# Patient Record
Sex: Male | Born: 1995 | Race: White | Hispanic: No | Marital: Single | Smoking: Current every day smoker
Health system: Southern US, Community
[De-identification: ages and names within clinical notes are randomized; demographics above are authoritative.]

## PROBLEM LIST (undated history)

## (undated) DIAGNOSIS — R002 Palpitations: Secondary | ICD-10-CM

## (undated) DIAGNOSIS — S060XAA Concussion with loss of consciousness status unknown, initial encounter: Secondary | ICD-10-CM

## (undated) DIAGNOSIS — S060X9A Concussion with loss of consciousness of unspecified duration, initial encounter: Secondary | ICD-10-CM

## (undated) HISTORY — DX: Palpitations: R00.2

## (undated) HISTORY — PX: NOSE SURGERY: SHX723

## (undated) HISTORY — PX: WRIST SURGERY: SHX841

---
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
## 2016-03-30 IMAGING — CT CT CERVICAL SPINE W/O CM
3 of 8 series · 6 of 33 positions shown, 7 images · non-contrast
Comparison: None.

CLINICAL DATA: Soccer injury. Patient landed on head. Concussion.
Cervicalgia/neck pain. Neck trauma.

EXAM:
CT CERVICAL SPINE WITHOUT CONTRAST
TECHNIQUE: Multidetector CT imaging of the cervical spine was performed without
intravenous contrast. Multiplanar CT image reconstructions were also
generated.

[Series 208: coronal 2 · coronal · 0.32mm/px · 1 of 38 slices shown]
[im 19/38  bone]
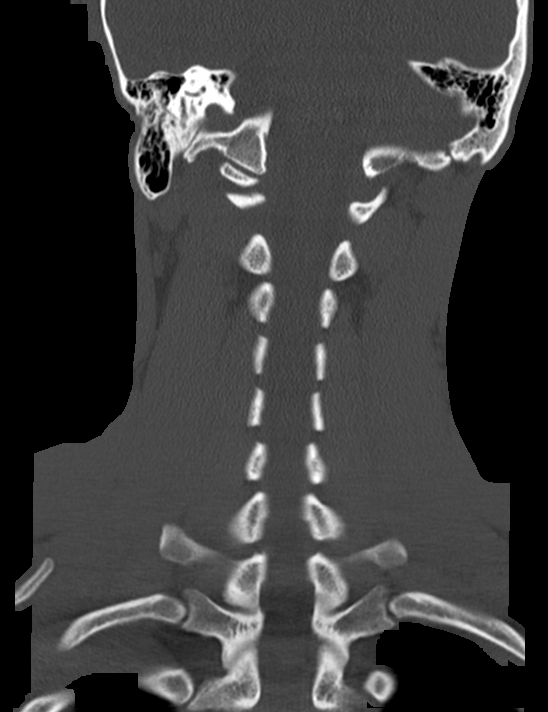

[Series 209: sagittal 2 · sagittal · 0.32mm/px · 2 of 36 slices shown]
[im 12/36  bone]
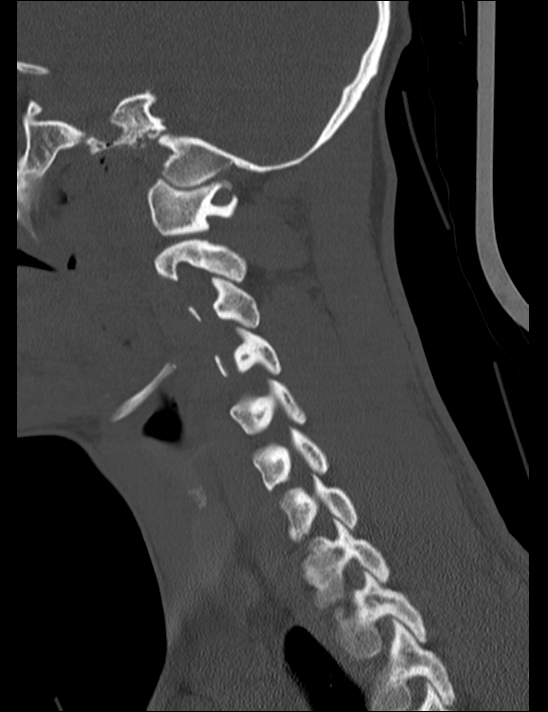
[im 24/36  bone]
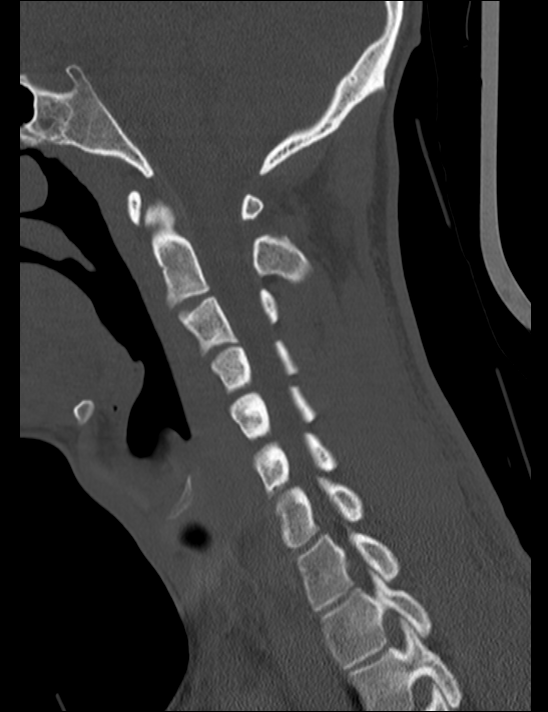

[Series 2010: ortog 2 · axial · 0.32mm/px · z∈[+727,+898]mm · 3 of 94 slices shown, 4 images]
[im 1/94  soft-tissue]
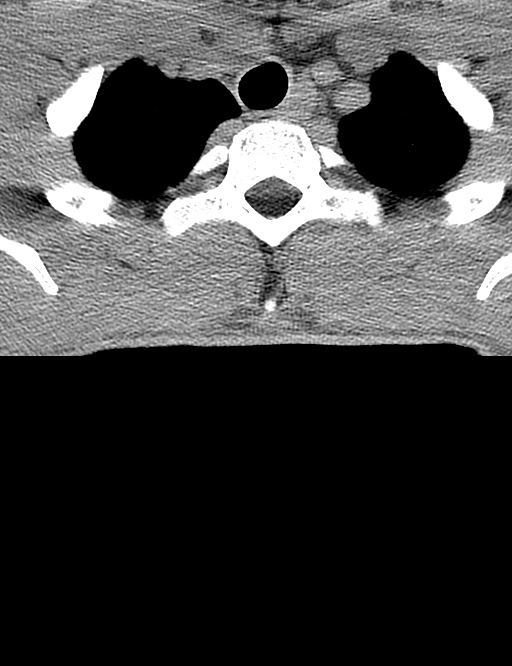
[im 1/94  bone]
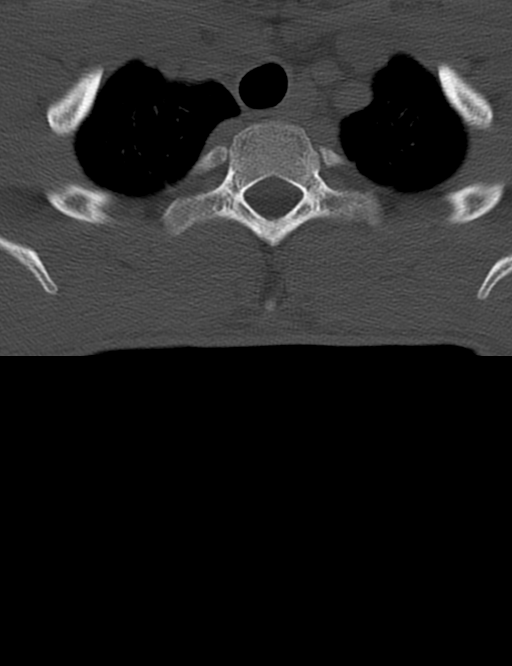
[im 47/94  bone]
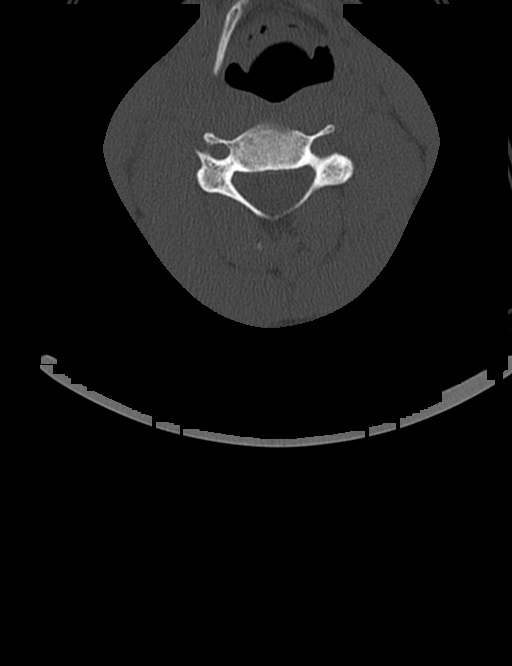
[im 94/94  bone]
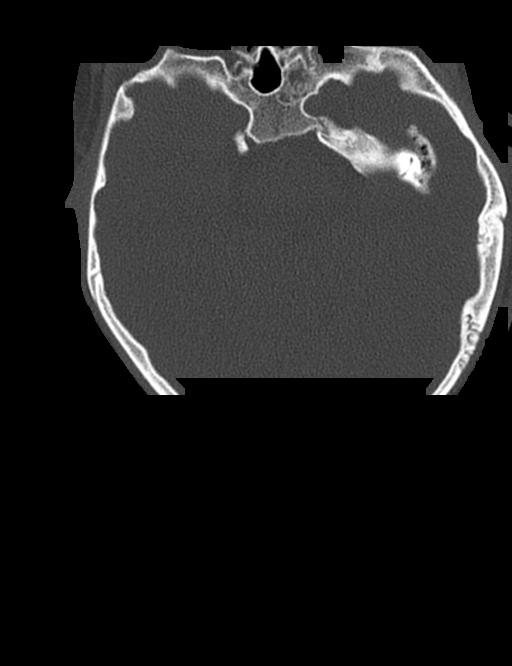

[6 of 33 positions shown; findings below may reference images not displayed]

FINDINGS: Straightening of the normal cervical lordosis is present mild
dextroconvex torticollis is probably positional. Cervical collar is
present. There is no cervical spine fracture. Craniocervical
alignment appears normal. The prevertebral soft tissues are normal.
Odontoid intact. Lung apices appear within normal limits.
IMPRESSION: Negative CT cervical spine.

## 2020-08-18 ENCOUNTER — Emergency Department (HOSPITAL_COMMUNITY)
Admission: EM | Admit: 2020-08-18 | Discharge: 2020-08-18 | Disposition: A | Discharge status: Home / Self Care | Payer: Managed Care, Other (non HMO) | Attending: Emergency Medicine | Admitting: Emergency Medicine

## 2020-08-18 ENCOUNTER — Emergency Department (HOSPITAL_COMMUNITY): Payer: Managed Care, Other (non HMO)

## 2020-08-18 ENCOUNTER — Other Ambulatory Visit: Payer: Self-pay

## 2020-08-18 ENCOUNTER — Encounter (HOSPITAL_COMMUNITY): Payer: Self-pay | Admitting: Emergency Medicine

## 2020-08-18 DIAGNOSIS — W2100XA Struck by hit or thrown ball, unspecified type, initial encounter: Secondary | ICD-10-CM | POA: Diagnosis not present

## 2020-08-18 DIAGNOSIS — Q738 Other reduction defects of unspecified limb(s): Secondary | ICD-10-CM

## 2020-08-18 DIAGNOSIS — S62101A Fracture of unspecified carpal bone, right wrist, initial encounter for closed fracture: Secondary | ICD-10-CM

## 2020-08-18 DIAGNOSIS — Y9366 Activity, soccer: Secondary | ICD-10-CM | POA: Insufficient documentation

## 2020-08-18 DIAGNOSIS — R52 Pain, unspecified: Secondary | ICD-10-CM

## 2020-08-18 DIAGNOSIS — S6991XA Unspecified injury of right wrist, hand and finger(s), initial encounter: Secondary | ICD-10-CM | POA: Diagnosis present

## 2020-08-18 DIAGNOSIS — S63004A Unspecified dislocation of right wrist and hand, initial encounter: Secondary | ICD-10-CM | POA: Insufficient documentation

## 2020-08-18 MED ORDER — OXYCODONE-ACETAMINOPHEN 5-325 MG PO TABS: 1.0000 | ORAL_TABLET | Freq: Four times a day (QID) | ORAL | 0 refills | Status: DC | PRN

## 2020-08-18 MED ORDER — FENTANYL CITRATE (PF) 100 MCG/2ML IJ SOLN
100.0000 ug | Freq: Once | INTRAMUSCULAR | Status: AC
  Administered 2020-08-18: 100 ug via INTRAVENOUS
  Filled 2020-08-18: qty 2

## 2020-08-18 MED ORDER — PROPOFOL 10 MG/ML IV BOLUS: 0.5000 mg/kg | Freq: Once | INTRAVENOUS | Status: DC

## 2020-08-18 MED ORDER — LIDOCAINE HCL 2 % IJ SOLN
10.0000 mL | Freq: Once | INTRAMUSCULAR | Status: DC
  Filled 2020-08-18: qty 20

## 2020-08-18 NOTE — Progress Notes (Addendum)
Orthopedic Tech Progress Note Patient Details:  James Anderson 1996-01-13 [id number]  Ortho Devices Type of Ortho Device: Arm sling,Sugartong splint Ortho Device/Splint Location: rue Ortho Device/Splint Interventions: Ordered,Application,Adjustment  I assisted dr with reduction then applied splint post reduction.  Post Interventions Patient Tolerated: Well Instructions Provided: Care of device,Adjustment of device   Trinna Post 08/18/2020, 8:56 PM

## 2020-08-18 NOTE — ED Provider Notes (Signed)
[facility] EMERGENCY DEPARTMENT Provider Note   CSN: [id number] Arrival date & time: 08/18/20  1646     History Chief Complaint  Patient presents with  . Wrist Pain  . Wrist dislocation    James Anderson is a 25 y.o. male.  The history is provided by the patient and medical records.  Wrist Pain   James Anderson is a 25 y.o. male who presents to the Emergency Department complaining of wrist injury. He is right-hand dominant was playing soccer and when a ball hit his dorsal right hand and he sustained an immediate wrist injury. Pain is severe and constant nature. No additional injuries.    Past Medical History:  Diagnosis Date  . Concussion     There are no problems to display for this patient.   Past Surgical History:  Procedure Laterality Date  . NOSE SURGERY    . WRIST SURGERY         No family history on file.  Social History   Tobacco Use  . Smoking status: Never Smoker  Substance Use Topics  . Alcohol use: No  . Drug use: No    Home Medications Prior to Admission medications   Medication Sig Start Date End Date Taking? Authorizing Provider  ibuprofen (ADVIL) 200 MG tablet Take 200 mg by mouth every 6 (six) hours as needed for moderate pain or headache.   Yes [provider]  oxyCODONE-acetaminophen (PERCOCET/ROXICET) 5-325 MG tablet Take 1 tablet by mouth every 6 (six) hours as needed for severe pain. 08/18/20  Yes Tilden Fossa, MD    Allergies    Morphine and related  Review of Systems   Review of Systems  All other systems reviewed and are negative.   Physical Exam Updated Vital Signs BP 120/88 (BP Location: Left Arm)   Pulse (!) 48   Temp 98.1 F (36.7 C) (Oral)   Resp 15   SpO2 100%   Physical Exam Vitals and nursing note reviewed.  Constitutional:      Appearance: He is well-developed.  HENT:     Head: Normocephalic and atraumatic.  Cardiovascular:     Rate and Rhythm: Normal rate and regular  rhythm.     Heart sounds: No murmur heard.   Pulmonary:     Effort: Pulmonary effort is normal. No respiratory distress.     Breath sounds: Normal breath sounds.  Abdominal:     Palpations: Abdomen is soft.     Tenderness: There is no abdominal tenderness. There is no guarding or rebound.  Musculoskeletal:        General: Swelling present.     Comments: 2+ right radial pulse. There is a deformity to the right wrist with wrist fixed plantar flexed. Wiggles fingers. Sensation to light touch intact throughout all of the digits.  Skin:    General: Skin is warm and dry.  Neurological:     Mental Status: He is alert and oriented to person, place, and time.  Psychiatric:        Behavior: Behavior normal.     ED Results / Procedures / Treatments   Labs (all labs ordered are listed, but only abnormal results are displayed) Labs Reviewed - No data to display  EKG None  Radiology DG Forearm Right  Result Date: 08/18/2020 CLINICAL DATA:  Right arm pain after injury. EXAM: RIGHT FOREARM - 2 VIEW COMPARISON:  None. FINDINGS: Severely displaced fracture is seen involving the distal right radius. Moderately displaced ulnar styloid  fracture is noted. IMPRESSION: Distal right radial fracture is noted with severe volar displacement of distal fracture fragment. Moderately displaced ulnar styloid fracture. Electronically Signed   By: Lupita Raider M.D.   On: 08/18/2020 18:29   DG Wrist 2 Views Right  Result Date: 08/18/2020 CLINICAL DATA:  Right wrist fracture.  Status post reduction. EXAM: RIGHT WRIST - 2 VIEW COMPARISON:  Prior today FINDINGS: Transverse fracture of the distal radial metaphysis is again seen. Anterior displacement of the distal fracture fragment is noted, but decreased since prior exam. No significant angulation is seen. Ulnar styloid process fracture again noted. Cast has been applied. IMPRESSION: Transverse fracture of the distal radial metaphysis, with decreased anterior  displacement and angulation since prior exam. Ulnar styloid process fracture again noted. Electronically Signed   By: Danae Orleans M.D.   On: 08/18/2020 20:50   DG Wrist 2 Views Right  Result Date: 08/18/2020 CLINICAL DATA:  Right wrist pain after fall. EXAM: RIGHT WRIST - 2 VIEW COMPARISON:  None. FINDINGS: Severely displaced fracture is seen involving the distal right radius. Distal fracture fragment is displaced in a volar direction. Moderately displaced ulnar styloid fracture is noted. IMPRESSION: Severely displaced distal right radial fracture is noted. Moderately displaced ulnar styloid fracture. Electronically Signed   By: Lupita Raider M.D.   On: 08/18/2020 18:27   CT Wrist Right Wo Contrast  Result Date: 08/18/2020 CLINICAL DATA:  Right wrist fracture. EXAM: CT OF THE RIGHT WRIST WITHOUT CONTRAST TECHNIQUE: Multidetector CT imaging of the right wrist was performed according to the standard protocol. Multiplanar CT image reconstructions were also generated. COMPARISON:  Radiographs of same day. FINDINGS: The right wrist has been splinted and immobilized. Moderately displaced ulnar styloid fracture is noted. There remains distal radial fracture with moderate volar displacement of the distal fracture fragment. No definite soft tissue abnormality is noted. IMPRESSION: Distal right radial fracture is noted with moderate volar displacement of the distal fracture fragment. Moderately displaced ulnar styloid fracture. Electronically Signed   By: Lupita Raider M.D.   On: 08/18/2020 22:14     Procedures .Ortho Injury Treatment  Date/Time: 08/18/2020 9:19 PM Performed by: Tilden Fossa, MD Authorized by: Tilden Fossa, MD   Consent:    Consent obtained:  Verbal   Risks discussed:  Fracture, irreducible dislocation, nerve damage, restricted joint movement and recurrent dislocationInjury location: wrist Location details: right wrist Injury type: fracture-dislocation Pre-procedure  neurovascular assessment: neurovascularly intact Pre-procedure distal perfusion: normal Pre-procedure neurological function: normal Pre-procedure range of motion: reduced Anesthesia: hematoma block  Anesthesia: Local anesthesia used: yes Local Anesthetic: lidocaine 2% without epinephrine  Patient sedated: NoManipulation performed: yes Immobilization: splint Splint type: sugar tong Splint Applied by: Ortho Tech Supplies used: Ortho-Glass,  elastic bandage and cotton padding Post-procedure neurovascular assessment: post-procedure neurovascularly intact Post-procedure distal perfusion: normal Post-procedure neurological function: normal Post-procedure range of motion: improved     SPLINT APPLICATION Date/Time: 11:12 PM Authorized by: Tilden Fossa Consent: Verbal consent obtained. Risks and benefits: risks, benefits and alternatives were discussed Consent given by: patient Splint applied by: orthopedic technician Location details: RUE Splint type: sugar tong Supplies used: orthoglass, ace wrap Post-procedure: The splinted body part was neurovascularly unchanged following the procedure. Patient tolerance: Patient tolerated the procedure well with no immediate complications.    Medications Ordered in ED Medications  lidocaine (XYLOCAINE) 2 % (with pres) injection 200 mg (has no administration in time range)  fentaNYL (SUBLIMAZE) injection 100 mcg (100 mcg Intravenous Given 08/18/20 1856)  fentaNYL (SUBLIMAZE) injection 100 mcg (100 mcg Intravenous Given 08/18/20 1954)    ED Course  I have reviewed the triage vital signs and the nursing notes.  Pertinent labs & imaging results that were available during my care of the patient were reviewed by me and considered in my medical decision making (see chart for details).    MDM Rules/Calculators/A&P                         Patient here for evaluation of right wrist injury that he sustained playing soccer. He has a fracture or  dislocation to the right wrist. Wrist reduced per procedure note. Discussed with Dr. Jena Gauss with orthopedics. Plan to place and splint with outpatient follow-up and return precautions.  Final Clinical Impression(s) / ED Diagnoses Final diagnoses:  Reduction defect of extremity  Closed fracture of right wrist, initial encounter    Rx / DC Orders ED Discharge Orders         Ordered    oxyCODONE-acetaminophen (PERCOCET/ROXICET) 5-325 MG tablet  Every 6 hours PRN        08/18/20 2212           Tilden Fossa, MD 08/18/20 2312

## 2020-08-18 NOTE — ED Triage Notes (Signed)
Patient was playing soccer and was hit by the soccer ball in the hand. Obvious deformity. VSS. NAD.

## 2020-08-18 NOTE — ED Notes (Signed)
Pt ambulated to the restroom with a steady gait.

## 2020-08-18 NOTE — ED Provider Notes (Signed)
Emergency Medicine Provider Triage Evaluation Note  James Anderson , a 25 y.o. male  was evaluated in triage.  Pt complains of right wrist injury while playing soccer. Soccer ball collided with hand causing immediate pain and deformity to wrist. Patient is right hand dominant. Pain radiates up arm. Denies numbness/tingling. No treatment prior to arrival.  Review of Systems  Positive: arthralgia Negative: Numbness/tingling  Physical Exam  BP 126/83 (BP Location: Left Arm)   Pulse (!) 51   Temp 98.4 F (36.9 C)   Resp 18   SpO2 100%  Gen:   Awake, no distress   Resp:  Normal effort  MSK:   Moves extremities without difficulty  Other:  Noticeable deformity to right wrist. No TTP of right elbow. Mild tenderness throughout forearm. Radial pulse intact. Soft compartments.  Medical Decision Making  Medically screening exam initiated at 5:27 PM.  Appropriate orders placed.  JAYVEION STALLING was informed that the remainder of the evaluation will be completed by another provider, this initial triage assessment does not replace that evaluation, and the importance of remaining in the ED until their evaluation is complete.  Possible wrist dislocation. X-rays ordered. Informed Asher Muir, charge RN need for room.    Jesusita Oka 08/18/20 1733    Jacalyn Lefevre, MD 08/18/20 [phone]

## 2020-08-18 NOTE — ED Notes (Signed)
Patient verbalizes understanding of discharge instructions. Opportunity for questioning and answers were provided. Armband removed by staff, pt discharged from ED ambulatory.   

## 2020-10-08 ENCOUNTER — Emergency Department
Admission: EM | Admit: 2020-10-08 | Discharge: 2020-10-08 | Disposition: A | Discharge status: Home / Self Care | Payer: Managed Care, Other (non HMO) | Attending: Emergency Medicine | Admitting: Emergency Medicine

## 2020-10-08 ENCOUNTER — Other Ambulatory Visit: Payer: Self-pay

## 2020-10-08 DIAGNOSIS — W228XXA Striking against or struck by other objects, initial encounter: Secondary | ICD-10-CM | POA: Diagnosis not present

## 2020-10-08 DIAGNOSIS — S0990XA Unspecified injury of head, initial encounter: Secondary | ICD-10-CM | POA: Diagnosis not present
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
## 2020-10-08 NOTE — Discharge Instructions (Addendum)
Use Tylenol for pain and fevers.  Up to 1000 mg per dose, up to 4 times per day.  Do not take more than 4000 mg of Tylenol/acetaminophen within 24 hours..  Use naproxen/Aleve for anti-inflammatory pain relief. Use up to 500mg every 12 hours. Do not take more frequently than this. Do not use other NSAIDs (ibuprofen, Advil) while taking this medication. It is safe to take Tylenol with this.   

## 2021-05-04 ENCOUNTER — Emergency Department
Admission: EM | Admit: 2021-05-04 | Discharge: 2021-05-04 | Disposition: A | Discharge status: Home / Self Care | Payer: Managed Care, Other (non HMO) | Attending: Emergency Medicine | Admitting: Emergency Medicine

## 2021-05-04 ENCOUNTER — Other Ambulatory Visit: Payer: Self-pay

## 2021-05-04 ENCOUNTER — Emergency Department: Payer: Managed Care, Other (non HMO)

## 2021-05-04 DIAGNOSIS — R0789 Other chest pain: Secondary | ICD-10-CM | POA: Insufficient documentation

## 2021-05-04 DIAGNOSIS — R002 Palpitations: Secondary | ICD-10-CM | POA: Insufficient documentation

## 2021-05-04 DIAGNOSIS — R079 Chest pain, unspecified: Secondary | ICD-10-CM

## 2021-05-04 LAB — CBC WITH DIFFERENTIAL/PLATELET
Abs Immature Granulocytes: 0.01 10*3/uL (ref 0.00–0.07)
Basophils Absolute: 0 10*3/uL (ref 0.0–0.1)
Basophils Relative: 0 %
Eosinophils Absolute: 0 10*3/uL (ref 0.0–0.5)
Eosinophils Relative: 0 %
HCT: 41.4 % (ref 39.0–52.0)
Hemoglobin: 14.5 g/dL (ref 13.0–17.0)
Immature Granulocytes: 0 %
Lymphocytes Relative: 23 %
Lymphs Abs: 1.6 10*3/uL (ref 0.7–4.0)
MCH: 33.5 pg (ref 26.0–34.0)
MCHC: 35 g/dL (ref 30.0–36.0)
MCV: 95.6 fL (ref 80.0–100.0)
Monocytes Absolute: 0.5 10*3/uL (ref 0.1–1.0)
Monocytes Relative: 7 %
Neutro Abs: 4.9 10*3/uL (ref 1.7–7.7)
Neutrophils Relative %: 70 %
Platelets: 189 10*3/uL (ref 150–400)
RBC: 4.33 MIL/uL (ref 4.22–5.81)
RDW: 10.9 % — ABNORMAL LOW (ref 11.5–15.5)
WBC: 7 10*3/uL (ref 4.0–10.5)
nRBC: 0 % (ref 0.0–0.2)

## 2021-05-04 LAB — BASIC METABOLIC PANEL
Anion gap: 7 (ref 5–15)
BUN: 21 mg/dL — ABNORMAL HIGH (ref 6–20)
CO2: 26 mmol/L (ref 22–32)
Calcium: 10 mg/dL (ref 8.9–10.3)
Chloride: 106 mmol/L (ref 98–111)
Creatinine, Ser: 0.98 mg/dL (ref 0.61–1.24)
GFR, Estimated: >60 mL/min (ref >60)
Glucose, Bld: 102 mg/dL — ABNORMAL HIGH (ref 70–99)
Potassium: 3.8 mmol/L (ref 3.5–5.1)
Sodium: 139 mmol/L (ref 135–145)

## 2021-05-04 LAB — TROPONIN I (HIGH SENSITIVITY): Troponin I (High Sensitivity): <2 ng/L (ref <18)

## 2021-05-04 LAB — TSH: TSH: 1.362 u[IU]/mL (ref 0.350–4.500)

## 2021-05-04 NOTE — ED Triage Notes (Signed)
Patient reports intermittent chest pain and palpitations since Thursday evening, states symptoms are worse at night. AOX4, ambulatory, denies shortness of breath.

## 2021-05-04 NOTE — ED Provider Notes (Signed)
[facility] Provider Note    Event Date/Time   First MD Initiated Contact with Patient 05/04/21 2021     (approximate)   History   Chief Complaint Palpitations   HPI  James Anderson is a 26 y.o. male with no significant past medical history presents to the ED complaining of palpitations.  Patient reports that for the past 4 nights he has been dealing with an intermittent feeling of "fluttering" in his chest.  He states it seems to happen particularly when he goes to lay down, last for only a few seconds at a time.  He will occasionally have discomfort in the left side of his chest which he describes as a "scraping" that also last for a few seconds at a time.  He denies any fevers, cough, or shortness of breath associated with these episodes.  He has continued to exercise without difficulty between the episodes, currently feels well with no symptoms.  He has never had similar symptoms in the past, denies significant caffeine consumption.  He has not noticed any pain or swelling in his legs.     Physical Exam   Triage Vital Signs: ED Triage Vitals  Enc Vitals Group     BP 05/04/21 2017 137/73     Pulse Rate 05/04/21 2017 80     Resp 05/04/21 2017 20     Temp 05/04/21 2017 98.9 F (37.2 C)     Temp Source 05/04/21 2017 Oral     SpO2 05/04/21 2017 99 %     Weight 05/04/21 2018 135 lb (61.2 kg)     Height 05/04/21 2018 5\' 7"  (1.702 m)     Head Circumference --      Peak Flow --      Pain Score 05/04/21 2017 0     Pain Loc --      Pain Edu? --      Excl. in GC? --     Most recent vital signs: Vitals:   05/04/21 2200 05/04/21 2230  BP: 127/73 (!) 113/58  Pulse: 64 (!) 54  Resp: 14 15  Temp:    SpO2: 99% 100%    Constitutional: Alert and oriented. Eyes: Conjunctivae are normal. Head: Atraumatic. Nose: No congestion/rhinnorhea. Mouth/Throat: Mucous membranes are moist.  Cardiovascular: Normal rate, regular rhythm. Grossly normal heart sounds.   2+ radial pulses bilaterally. Respiratory: Normal respiratory effort.  No retractions. Lungs CTAB. Gastrointestinal: Soft and nontender. No distention. Musculoskeletal: No lower extremity tenderness nor edema.  Neurologic:  Normal speech and language. No gross focal neurologic deficits are appreciated.    ED Results / Procedures / Treatments   Labs (all labs ordered are listed, but only abnormal results are displayed) Labs Reviewed  CBC WITH DIFFERENTIAL/PLATELET - Abnormal; Notable for the following components:      Result Value   RDW 10.9 (*)    All other components within normal limits  BASIC METABOLIC PANEL - Abnormal; Notable for the following components:   Glucose, Bld 102 (*)    BUN 21 (*)    All other components within normal limits  TSH  TROPONIN I (HIGH SENSITIVITY)     EKG  ED ECG REPORT I, 07/02/21, the attending physician, personally viewed and interpreted this ECG.   Date: 05/04/2021  EKG Time: 20:14  Rate: 79  Rhythm: normal sinus rhythm, sinus arrhythmia  Axis: RAD  Intervals:none  ST&T Change: None  RADIOLOGY Chest x-ray reviewed by me with no infiltrate, edema, or  effusion.  PROCEDURES:  Critical Care performed: No  .1-3 Lead EKG Interpretation Performed by: Chesley Noon, MD Authorized by: Chesley Noon, MD     Interpretation: normal     ECG rate:  65-80   ECG rate assessment: normal     Rhythm: sinus rhythm     Ectopy: none     Conduction: normal     MEDICATIONS ORDERED IN ED: Medications - No data to display   IMPRESSION / MDM / ASSESSMENT AND PLAN / ED COURSE  I reviewed the triage vital signs and the nursing notes.                              26 y.o. male with no significant past medical history presents to the ED complaining of intermittent palpitations and "scraping" discomfort over the left side of his chest for the past 4 days.  Differential diagnosis includes, but is not limited to, arrhythmia, ACS, PE,  pneumonia, pneumothorax, anxiety, GERD, hyperthyroidism.  Patient is nontoxic-appearing and in no acute distress, vital signs are unremarkable here in the ED.  EKG shows normal sinus rhythm with sinus arrhythmia, no ischemic changes noted.  Given his chest pain we will screen troponin, but overall very low suspicion for ACS given atypical symptoms.  With intermittent symptoms, I doubt dissection or PE.  We will also check CBC, BMP, TSH, and chest x-ray.  Labs are reassuring, CBC with no anemia and BMP with no electrolyte abnormality.  TSH is within normal limits and chest x-ray is unremarkable, troponin also negative.  No events noted on cardiac monitor and patient is appropriate for discharge home with outpatient cardiology follow-up for potential Holter monitor.  He was counseled to return to the ED for new worsening symptoms, patient agrees with plan.  The patient is on the cardiac monitor to evaluate for evidence of arrhythmia and/or significant heart rate changes.      FINAL CLINICAL IMPRESSION(S) / ED DIAGNOSES   Final diagnoses:  Palpitations  Chest pain, unspecified type     Rx / DC Orders   ED Discharge Orders     None        Note:  This document was prepared using Dragon voice recognition software and may include unintentional dictation errors.   Chesley Noon, MD 05/04/21 2248

## 2021-05-05 ENCOUNTER — Emergency Department
Admission: EM | Admit: 2021-05-05 | Discharge: 2021-05-06 | Disposition: A | Discharge status: Home / Self Care | Payer: Managed Care, Other (non HMO) | Attending: Emergency Medicine | Admitting: Emergency Medicine

## 2021-05-05 ENCOUNTER — Emergency Department: Payer: Managed Care, Other (non HMO)

## 2021-05-05 ENCOUNTER — Other Ambulatory Visit: Payer: Self-pay

## 2021-05-05 DIAGNOSIS — R0602 Shortness of breath: Secondary | ICD-10-CM

## 2021-05-05 DIAGNOSIS — R002 Palpitations: Secondary | ICD-10-CM

## 2021-05-05 DIAGNOSIS — R42 Dizziness and giddiness: Secondary | ICD-10-CM | POA: Insufficient documentation

## 2021-05-05 DIAGNOSIS — R55 Syncope and collapse: Secondary | ICD-10-CM | POA: Diagnosis not present

## 2021-05-05 LAB — CBC
HCT: 40.4 % (ref 39.0–52.0)
Hemoglobin: 14 g/dL (ref 13.0–17.0)
MCH: 33.3 pg (ref 26.0–34.0)
MCHC: 34.7 g/dL (ref 30.0–36.0)
MCV: 96.2 fL (ref 80.0–100.0)
Platelets: 202 10*3/uL (ref 150–400)
RBC: 4.2 MIL/uL — ABNORMAL LOW (ref 4.22–5.81)
RDW: 11 % — ABNORMAL LOW (ref 11.5–15.5)
WBC: 7 10*3/uL (ref 4.0–10.5)
nRBC: 0 % (ref 0.0–0.2)

## 2021-05-05 LAB — BASIC METABOLIC PANEL
Anion gap: 7 (ref 5–15)
BUN: 15 mg/dL (ref 6–20)
CO2: 26 mmol/L (ref 22–32)
Calcium: 9.8 mg/dL (ref 8.9–10.3)
Chloride: 104 mmol/L (ref 98–111)
Creatinine, Ser: 0.93 mg/dL (ref 0.61–1.24)
GFR, Estimated: >60 mL/min (ref >60)
Glucose, Bld: 118 mg/dL — ABNORMAL HIGH (ref 70–99)
Potassium: 4.3 mmol/L (ref 3.5–5.1)
Sodium: 137 mmol/L (ref 135–145)

## 2021-05-05 LAB — TROPONIN I (HIGH SENSITIVITY): Troponin I (High Sensitivity): <2 ng/L (ref <18)

## 2021-05-05 MED ORDER — IOHEXOL 350 MG/ML SOLN
100.0000 mL | Freq: Once | INTRAVENOUS | Status: AC | PRN
  Administered 2021-05-05: 100 mL via INTRAVENOUS
  Filled 2021-05-05: qty 100

## 2021-05-05 NOTE — ED Provider Notes (Signed)
[facility] Provider Note    Event Date/Time   First MD Initiated Contact with Patient 05/05/21 2151     (approximate)   History   Chief Complaint Shortness of Breath   HPI  James Anderson is a 26 y.o. male with no significant past medical history presents to the ED complaining of shortness of breath and near syncope.  Patient was seen in the ED last night for palpitations and some discomfort in his chest, work-up was unremarkable at that time and he was discharged home with plan for cardiology follow-up.  He states that he was able to schedule a cardiology appointment in 2 days, but dealt with some worsening symptoms earlier today.  He was driving on [place] way to [employer] this evening when he again had some palpitations and felt slightly short of breath.  He began to feel lightheaded and like he might pass out, was able to pull over to the side of the road and never fully lost consciousness.  He states that he has not had any pain in his chest today but continues to have intermittent palpitations.  He denies significant caffeine or alcohol consumption, denies drug use.     Physical Exam   Triage Vital Signs: ED Triage Vitals  Enc Vitals Group     BP 05/05/21 2126 (!) 142/88     Pulse Rate 05/05/21 2126 75     Resp 05/05/21 2126 18     Temp 05/05/21 2126 98.7 F (37.1 C)     Temp Source 05/05/21 2126 Oral     SpO2 05/05/21 2126 98 %     Weight 05/05/21 2126 135 lb (61.2 kg)     Height 05/05/21 2126 5\' 7"  (1.702 m)     Head Circumference --      Peak Flow --      Pain Score 05/05/21 2131 0     Pain Loc --      Pain Edu? --      Excl. in GC? --     Most recent vital signs: Vitals:   05/05/21 2126 05/05/21 2130  BP: (!) 142/88 (!) 142/88  Pulse: 75 (!) 124  Resp: 18 14  Temp: 98.7 F (37.1 C) 98.7 F (37.1 C)  SpO2: 98% 98%    Constitutional: Alert and oriented. Eyes: Conjunctivae are normal. Head: Atraumatic. Nose: No  congestion/rhinnorhea. Mouth/Throat: Mucous membranes are moist.  Cardiovascular: Normal rate, regular rhythm. Grossly normal heart sounds.  2+ radial pulses bilaterally. Respiratory: Normal respiratory effort.  No retractions. Lungs CTAB. Gastrointestinal: Soft and nontender. No distention. Musculoskeletal: No lower extremity tenderness nor edema.  Neurologic:  Normal speech and language. No gross focal neurologic deficits are appreciated.    ED Results / Procedures / Treatments   Labs (all labs ordered are listed, but only abnormal results are displayed) Labs Reviewed  BASIC METABOLIC PANEL - Abnormal; Notable for the following components:      Result Value   Glucose, Bld 118 (*)    All other components within normal limits  CBC - Abnormal; Notable for the following components:   RBC 4.20 (*)    RDW 11.0 (*)    All other components within normal limits  TROPONIN I (HIGH SENSITIVITY)     EKG  ED ECG REPORT I, 07/03/21, the attending physician, personally viewed and interpreted this ECG.   Date: 05/05/2021  EKG Time: 21:31  Rate: 70  Rhythm: normal sinus rhythm  Axis: RAD  Intervals:none  ST&T Change: Inferior Q waves noted  RADIOLOGY CTA of chest reviewed by me with no obvious pulmonary embolism.  PROCEDURES:  Critical Care performed: No  .1-3 Lead EKG Interpretation Performed by: Chesley Noon, MD Authorized by: Chesley Noon, MD     Interpretation: normal     ECG rate:  65-80   ECG rate assessment: normal     Rhythm: sinus rhythm     Ectopy: none     Conduction: normal     MEDICATIONS ORDERED IN ED: Medications  iohexol (OMNIPAQUE) 350 MG/ML injection 100 mL (100 mLs Intravenous Contrast Given 05/05/21 2243)     IMPRESSION / MDM / ASSESSMENT AND PLAN / ED COURSE  I reviewed the triage vital signs and the nursing notes.                              26 y.o. male with no significant past medical history who presents to the ED complaining  of intermittent palpitations for the past couple of days, slightly worse this evening with episode of near syncope and mild shortness of breath.  Differential diagnosis includes, but is not limited to, ACS, PE, arrhythmia, pneumonia, electrolyte abnormality.  Patient is nontoxic-appearing and in no acute distress, was briefly tachycardic in triage however this is resolved at the time of my evaluation.  EKG shows normal sinus rhythm with right axis deviation but no ischemic changes.  Work-up from last night's ED visit was unremarkable, thyroid function within normal limits at that time.  Labs are again reassuring today, troponin within normal limits, CBC shows no anemia and BMP shows no electrolyte abnormality.  CTA of chest was performed due to shortness of breath and near syncope, however this is negative for acute process and shows no evidence of PE.  No events were noted on cardiac monitor and patient is appropriate for outpatient follow-up, states he has an appoint with cardiology in 2 days.  He was again counseled to return to the ED for new or worsening symptoms, patient agrees with plan.  The patient is on the cardiac monitor to evaluate for evidence of arrhythmia and/or significant heart rate changes.      FINAL CLINICAL IMPRESSION(S) / ED DIAGNOSES   Final diagnoses:  Palpitations  Shortness of breath  Near syncope     Rx / DC Orders   ED Discharge Orders     None        Note:  This document was prepared using Dragon voice recognition software and may include unintentional dictation errors.   Chesley Noon, MD 05/05/21 2350

## 2021-05-05 NOTE — ED Triage Notes (Signed)
Pt presents via POV c/o SOB tonight. Pt reports felt episode of light headedness on the way to [employer] tonight with episode of SOB. Reports felt like couldn't take a deep breath.

## 2021-05-06 NOTE — ED Notes (Signed)
E signature pad not working. Pt educated on discharge instructions and verbalized understanding.  

## 2021-05-06 NOTE — Progress Notes (Signed)
New Outpatient Visit Date: 05/07/2021  Referring Provider: None  Chief Complaint: Palpitations  HPI:  [name] is a 26 y.o. male who is being seen today for the evaluation of palpitations.  He has no significant past medical history.  He was seen in the emergency department twice this week for palpitations.  He noted some associated shortness of breath.  ED work-up was unremarkable other than mild sinus tachycardia noted at triage.  Troponins were negative.  CTA chest was negative for PE or other acute abnormality.  [name] reports that he began to experience palpitations last Thursday (6 days ago).  He states that he would develop a brief flutter in the center of his chest followed by the sensation of his heart beating more forcefully than normal.  Initially, he did not have any associated symptoms, though 2 days ago he began to feel a "foggy" in his head that prompted him to return to the ED 2 days ago.  He continues to have palpitations on a daily basis as well as the "foggy" feeling in his head.  He has not had frank lightheadedness nor syncope.  He states that it sometimes feels like a fingernail is scratching across the lower margin of his left rib cage.  This seems to accompany his palpitations but also predates them, having been present for about a year.  He otherwise does not have any chest pain including with exertion.  He denies frank dyspnea though at times he feels like his breaths are shallow.  [name] is typically quite active, having played soccer through college and even afterwards.  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  He denies a history of heart disease and prior cardiac testing.  [name] consumes 1 or 2 caffeinated beverages a day, which is not new.  He is planning to propose to his girlfriend in the near future and wonders if stress may be contributing to his  symptoms.  --------------------------------------------------------------------------------------------------  Cardiovascular History & Procedures: Cardiovascular Problems: Palpitations Chest wall pain  Risk Factors: Male gender  Cath/PCI: None  CV Surgery: None  EP Procedures and Devices: None  Non-Invasive Evaluation(s): None  Recent CV Pertinent Labs: Lab Results  Component Value Date   K 4.3 05/05/2021   BUN 15 05/05/2021   CREATININE 0.93 05/05/2021    --------------------------------------------------------------------------------------------------  Past Medical History:  Diagnosis Date   Concussion     Past Surgical History:  Procedure Laterality Date   NOSE SURGERY     WRIST SURGERY Bilateral    2008 (left), 2022 (right)    Medications: None  Allergies: Morphine and related and Zyrtec [cetirizine]  Social History   Tobacco Use   Smoking status: Never  Vaping Use   Vaping Use: Former  Substance Use Topics   Alcohol use: Yes    Comment: 2-3 drinks on Friday nights   Drug use: Not Currently    Types: Marijuana    Comment: None since finished college ~1.5 years ago    Family History  Problem Relation Age of Onset   Hypertension Father    Diabetes Maternal Grandmother    Heart attack Maternal Grandfather    Heart attack Paternal Grandfather    Arrhythmia Other    Arrhythmia Cousin        Syncope    Review of Systems: A 12-system review of systems was performed and was negative except as noted in the HPI.  --------------------------------------------------------------------------------------------------  Physical Exam: BP 120/70 (BP Location:  Left Arm, Patient Position: Sitting, Cuff Size: Normal)    Pulse 87    Ht 5\' 6"  (1.676 m)    Wt 130 lb (59 kg)    SpO2 98%    BMI 20.98 kg/m   General: NAD.  Accompanied by his mother. HEENT: No conjunctival pallor or scleral icterus. Facemask in place. Neck: Supple without lymphadenopathy,  thyromegaly, JVD, or HJR. No carotid bruit. Lungs: Normal work of breathing. Clear to auscultation bilaterally without wheezes or crackles. Heart: Regular rate and rhythm without murmurs, rubs, or gallops. Non-displaced PMI. Abd: Bowel sounds present. Soft, NT/ND without hepatosplenomegaly Ext: No lower extremity edema. Radial, PT, and DP pulses are 2+ bilaterally Skin: Warm and dry without rash. Neuro: CNIII-XII intact. Strength and fine-touch sensation intact in upper and lower extremities bilaterally. Psych: Normal mood and affect.  EKG: Normal sinus rhythm with sinus arrhythmia and rightward axis.  No significant change from prior tracing on 05/05/2021.  Lab Results  Component Value Date   WBC 7.0 05/05/2021   HGB 14.0 05/05/2021   HCT 40.4 05/05/2021   MCV 96.2 05/05/2021   PLT 202 05/05/2021    Lab Results  Component Value Date   NA 137 05/05/2021   K 4.3 05/05/2021   CL 104 05/05/2021   CO2 26 05/05/2021   BUN 15 05/05/2021   CREATININE 0.93 05/05/2021   GLUCOSE 118 (H) 05/05/2021   Lab Results  Component Value Date   TSH 1.362 05/04/2021   --------------------------------------------------------------------------------------------------  ASSESSMENT AND PLAN: Palpitations and dizziness: Symptoms have been present for about a week without obvious precipitant.  [name] reports a "foggy" sensation that accompanies the palpitations but no syncope or frank chest pain.  EKG and physical exam today are unrevealing.  Recent ED evaluation x2 was also unremarkable.  We have agreed to obtain a 14-day event monitor for further evaluation.  We will readdress echocardiogram after completing the event monitor based on its results and progression of symptoms.  I encouraged [name] to minimize his caffeine intake.  Follow-up: Return to clinic in 6 weeks.  Loleta Chance, MD 05/07/2021 1:44 PM

## 2021-05-07 ENCOUNTER — Other Ambulatory Visit: Payer: Self-pay

## 2021-05-07 ENCOUNTER — Ambulatory Visit: Payer: Managed Care, Other (non HMO) | Admitting: Internal Medicine

## 2021-05-07 ENCOUNTER — Encounter: Payer: Self-pay | Admitting: Internal Medicine

## 2021-05-07 ENCOUNTER — Ambulatory Visit (INDEPENDENT_AMBULATORY_CARE_PROVIDER_SITE_OTHER): Payer: Managed Care, Other (non HMO)

## 2021-05-07 VITALS — BP 120/70 | HR 87 | Ht 66.0 in | Wt 130.0 lb

## 2021-05-07 DIAGNOSIS — R42 Dizziness and giddiness: Secondary | ICD-10-CM | POA: Diagnosis not present

## 2021-05-07 DIAGNOSIS — R002 Palpitations: Secondary | ICD-10-CM

## 2021-05-07 NOTE — Patient Instructions (Signed)
Medication Instructions:   Your physician recommends that you continue on your current medications as directed. Please refer to the Current Medication list given to you today.   *If you need a refill on your cardiac medications before your next appointment, please call your pharmacy*   Lab Work:  None ordered  Testing/Procedures:  Your physician has recommended that you wear a Zio monitor for TWO WEEKS.  This will be mailed to you.   This monitor is a medical device that records the hearts electrical activity. Doctors most often use these monitors to diagnose arrhythmias. Arrhythmias are problems with the speed or rhythm of the heartbeat. The monitor is a small device applied to your chest. You can wear one while you do your normal daily activities. While wearing this monitor if you have any symptoms to push the button and record what you felt. Once you have worn this monitor for the period of time provider prescribed (Usually 14 days), you will return the monitor device in the postage paid box. Once it is returned they will download the data collected and provide [place] with a report which the provider will then review and we will call you with those results. Important tips:  Avoid showering during the first 24 hours of wearing the monitor. Avoid excessive sweating to help maximize wear time. Do not submerge the device, no hot tubs, and no swimming pools. Keep any lotions or oils away from the patch. After 24 hours you may shower with the patch on. Take brief showers with your back facing the shower head.  Do not remove patch once it has been placed because that will interrupt data and decrease adhesive wear time. Push the button when you have any symptoms and write down what you were feeling. Once you have completed wearing your monitor, remove and place into box which has postage paid and place in your outgoing mailbox.  If for some reason you have misplaced your box then call our office  and we can provide another box and/or mail it off for you.      Follow-Up: At [facility], you and your health needs are our priority.  As part of our continuing mission to provide you with exceptional heart care, we have created designated Provider Care Teams.  These Care Teams include your primary Cardiologist (physician) and Advanced Practice Providers (APPs -  Physician Assistants and Nurse Practitioners) who all work together to provide you with the care you need, when you need it.  We recommend signing up for the patient portal called "MyChart".  Sign up information is provided on this After Visit Summary.  MyChart is used to connect with patients for Virtual Visits (Telemedicine).  Patients are able to view lab/test results, encounter notes, upcoming appointments, etc.  Non-urgent messages can be sent to your provider as well.   To learn more about what you can do with MyChart, go to [email or web address].    Your next appointment:   6 week(s)  The format for your next appointment:   In Person  Provider:   You may see Dr. Cristal Deer End or one of the following Advanced Practice Providers on your designated Care Team:   Nicolasa Ducking, NP Eula Listen, PA-C Cadence Fransico Michael, [place]

## 2021-05-09 DIAGNOSIS — R002 Palpitations: Secondary | ICD-10-CM | POA: Diagnosis not present

## 2021-05-30 ENCOUNTER — Telehealth: Payer: Self-pay | Admitting: *Deleted

## 2021-05-30 NOTE — Telephone Encounter (Signed)
-----   Message from Yvonne Kendall, MD sent at 05/29/2021  7:09 PM EST ----- ?Please let James Anderson know that his event monitor showed a few extra beats but no significant arrhythmia to explain his symptoms.  He should follow-up as previously scheduled to reassess his symptoms and determine if additional testing or medication changes are needed. ?

## 2021-05-30 NOTE — Telephone Encounter (Signed)
Attempted to call pt. No answer. Lmtcb.  ?No DPR listed.  ?

## 2021-06-10 ENCOUNTER — Ambulatory Visit: Payer: Managed Care, Other (non HMO) | Admitting: Nurse Practitioner

## 2021-06-10 ENCOUNTER — Encounter: Payer: Self-pay | Admitting: Nurse Practitioner

## 2021-06-10 ENCOUNTER — Other Ambulatory Visit: Payer: Self-pay

## 2021-06-10 VITALS — BP 122/82 | HR 69 | Ht 67.0 in | Wt 134.0 lb

## 2021-06-10 DIAGNOSIS — R002 Palpitations: Secondary | ICD-10-CM | POA: Diagnosis not present

## 2021-06-10 NOTE — Patient Instructions (Signed)
Medication Instructions:  ?No new medications ? ?*If you need a refill on your cardiac medications before your next appointment, please call your pharmacy* ? ?Lab Work: ?No new labs ? ?Testing/Procedures: ?No testing needed at this time ? ? ?Follow-Up: ?At [facility], you and your health needs are our priority.  As part of our continuing mission to provide you with exceptional heart care, we have created designated Provider Care Teams.  These Care Teams include your primary Cardiologist (physician) and Advanced Practice Providers (APPs -  Physician Assistants and Nurse Practitioners) who all work together to provide you with the care you need, when you need it. ? ?We recommend signing up for the patient portal called "MyChart".  Sign up information is provided on this After Visit Summary.  MyChart is used to connect with patients for Virtual Visits (Telemedicine).  Patients are able to view lab/test results, encounter notes, upcoming appointments, etc.  Non-urgent messages can be sent to your provider as well.   ?To learn more about what you can do with MyChart, go to [email or web address].   ? ?Your next appointment:   ?You may follow-as needed  ? ?

## 2021-06-10 NOTE — Progress Notes (Signed)
? ? ?Office Visit  ?  ?Patient Name: James Anderson ?Date of Encounter: 06/10/2021 ? ?Primary Care Provider:  Pcp, No ?Primary Cardiologist:  Yvonne Kendall, MD ? ?Chief Complaint  ?  ?26 year old male who presents for follow-up of palpitations and recent event monitoring. ? ?Past Medical History  ?  ?Past Medical History:  ?Diagnosis Date  ? Concussion   ? Palpitations   ? a. 04/2021 Zio: Predominantly sinus rhythm at 76 bpm (38-182).  Triggered events corresponded to sinus rhythm and rare isolated PACs/PVCs.  No sustained arrhythmias or prolonged pauses.  ? ?Past Surgical History:  ?Procedure Laterality Date  ? NOSE SURGERY    ? WRIST SURGERY Bilateral   ? 2008 (left), 2022 (right)  ? ? ?Allergies ? ?Allergies  ?Allergen Reactions  ? Morphine And Related Anaphylaxis  ? Zyrtec [Cetirizine] Hives  ? ? ?History of Present Illness  ?  ?26 year old male with a history of palpitations and prior concussion.  Earlier this year, he was seen in the emergency department secondary to palpitations associated with dyspnea.  ED work-up was unremarkable other than mild sinus tachycardia noted in triage.  CT of the chest was negative for PE or other acute abnormality.  He was seen in cardiology clinic on February 8 at which time he reported intermittent palpitations and a "foggy" feeling in his head.  ECG and physical exam were unremarkable.  He underwent event monitoring which showed predominantly sinus rhythm.  Triggered events corresponded to sinus rhythm and rare isolated PACs/PVCs.  There were no sustained arrhythmias or pauses. ? ?Since his last visit, he has done reasonably well.  He notes that his palpitations occur less frequently and at this point, they are really only occurring when he is lying in bed at night.  They are noticeable but not bothersome.  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  He enjoys his work and is active without  symptoms or limitations.  He sometimes notices lightheadedness with quick position changes, though this is rare.  He thinks he is adequately hydrating.  He denies chest pain, dyspnea, PND, orthopnea, syncope, edema, or early satiety. ? ?Home Medications  ?  ?None  ? ?Review of Systems  ?  ?Occasional palpitations when lying still in the evenings-overall less frequent than previously reported.  Rare lightheaded episodes.  He denies chest pain, dyspnea, PND, orthopnea, syncope, edema, or early satiety.  All other systems reviewed and are otherwise negative except as noted above. ?  ? ?Physical Exam  ?  ?VS:  BP 122/82 (BP Location: Left Arm, Patient Position: Sitting, Cuff Size: Normal)   Pulse 69   Ht 5\' 7"  (1.702 m)   Wt 134 lb (60.8 kg)   SpO2 99%   BMI 20.99 kg/m?  , BMI Body mass index is 20.99 kg/m?. ?    ?GEN: Well nourished, well developed, in no acute distress. ?HEENT: normal. ?Neck: Supple, no JVD, carotid bruits, or masses. ?Cardiac: RRR, no murmurs, rubs, or gallops. No clubbing, cyanosis, edema.  Radials/DP/PT 2+ and equal bilaterally.  ?Respiratory:  Respirations regular and unlabored, clear to auscultation bilaterally. ?GI: Soft, nontender, nondistended, BS + x 4. ?MS: no deformity or atrophy. ?Skin: warm and dry, no rash. ?Neuro:  Strength and sensation are intact. ?Psych: Normal affect. ? ?Accessory Clinical Findings  ?  ? ?Lab Results  ?Component Value Date  ? WBC 7.0 05/05/2021  ? HGB  14.0 05/05/2021  ? HCT 40.4 05/05/2021  ? MCV 96.2 05/05/2021  ? PLT 202 05/05/2021  ? ?Lab Results  ?Component Value Date  ? CREATININE 0.93 05/05/2021  ? BUN 15 05/05/2021  ? NA 137 05/05/2021  ? K 4.3 05/05/2021  ? CL 104 05/05/2021  ? CO2 26 05/05/2021  ? ? ?Assessment & Plan  ?  ?1.  Palpitations: Patient previously seen for palpitations with subsequent event monitoring showing predominantly sinus rhythm with rare PACs and PVCs.  There were no sustained arrhythmias or pauses.  Since his last visit, he has  noticed a decrease in frequency of palpitations, though he still occasionally feels skipped heartbeats when lying in bed at night.  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  We discussed his monitoring results in detail today and he is reassured by her findings.  No further work-up is warranted at this time.  I did show him how to use his Apple Watch to assess his heart rate and we discussed the benefits of potentially upgrading for monitoring of ECG if symptoms worsen.  I also encouraged him to monitor for potential triggers.  He does drink alcohol on the weekends and I encouraged him to reduce alcohol intake.   ? ?2.  Disposition: Follow-up as needed. ? ? ?Nicolasa Ducking, NP ?06/10/2021, 10:29 AM ? ?

## 2021-06-11 NOTE — Telephone Encounter (Signed)
Per result note 06/05/21 the patient has been notified of the result and verbalized understanding.  All questions (if any) were answered. ? ? ?

## 2021-09-12 ENCOUNTER — Emergency Department (HOSPITAL_COMMUNITY)
Admission: EM | Admit: 2021-09-12 | Discharge: 2021-09-12 | Disposition: A | Discharge status: Home / Self Care | Payer: Managed Care, Other (non HMO) | Attending: Emergency Medicine | Admitting: Emergency Medicine

## 2021-09-12 ENCOUNTER — Encounter (HOSPITAL_COMMUNITY): Payer: Self-pay

## 2021-09-12 ENCOUNTER — Other Ambulatory Visit: Payer: Self-pay

## 2021-09-12 DIAGNOSIS — R002 Palpitations: Secondary | ICD-10-CM | POA: Diagnosis present

## 2021-09-12 DIAGNOSIS — R202 Paresthesia of skin: Secondary | ICD-10-CM | POA: Diagnosis not present

## 2021-09-12 LAB — BASIC METABOLIC PANEL
Anion gap: 9 (ref 5–15)
BUN: 11 mg/dL (ref 6–20)
CO2: 25 mmol/L (ref 22–32)
Calcium: 9.7 mg/dL (ref 8.9–10.3)
Chloride: 104 mmol/L (ref 98–111)
Creatinine, Ser: 1.06 mg/dL (ref 0.61–1.24)
GFR, Estimated: >60 mL/min (ref >60)
Glucose, Bld: 99 mg/dL (ref 70–99)
Potassium: 3.4 mmol/L — ABNORMAL LOW (ref 3.5–5.1)
Sodium: 138 mmol/L (ref 135–145)

## 2021-09-12 LAB — CBC
HCT: 35.9 % — ABNORMAL LOW (ref 39.0–52.0)
Hemoglobin: 12.6 g/dL — ABNORMAL LOW (ref 13.0–17.0)
MCH: 33.4 pg (ref 26.0–34.0)
MCHC: 35.1 g/dL (ref 30.0–36.0)
MCV: 95.2 fL (ref 80.0–100.0)
Platelets: 167 10*3/uL (ref 150–400)
RBC: 3.77 MIL/uL — ABNORMAL LOW (ref 4.22–5.81)
RDW: 11 % — ABNORMAL LOW (ref 11.5–15.5)
WBC: 5 10*3/uL (ref 4.0–10.5)
nRBC: 0 % (ref 0.0–0.2)

## 2021-09-12 NOTE — ED Notes (Signed)
Pt in room. Denies SOB/CP and HA. Pt has had a few episodes of palpitations in the past with no specific dx. Pt no longer having Palpitations.

## 2021-09-12 NOTE — ED Provider Triage Note (Cosign Needed Addendum)
Emergency Medicine Provider Triage Evaluation Note  James Anderson , a 26 y.o. male  was evaluated in triage.  Pt complains of irregular heart rhythm. Pt states he was driving when he felt a rush in his head, tingling in his bilateral hands, and fluttering feeling in his chest. Denied pain. This lasted for about an hour before gradually improving. Called 911 and EMS evaluated him and recommended evaluation in the ER due to irregular EKG.   Also reports using new THC vape recently   Review of Systems  Positive: As above Negative: Chest pain, SOB, syncope  Physical Exam  BP 116/86 (BP Location: Right Arm)   Pulse 81   Temp 98.1 F (36.7 C) (Oral)   Resp 16   Ht 5\' 7"  (1.702 m)   Wt 61.2 kg   SpO2 98%   BMI 21.14 kg/m  Gen:   Awake, no distress   Resp:  Normal effort  MSK:   Moves extremities without difficulty  Other:    Medical Decision Making  Medically screening exam initiated at 10:14 AM.  Appropriate orders placed.  VEDANSH KERSTETTER was informed that the remainder of the evaluation will be completed by another provider, this initial triage assessment does not replace that evaluation, and the importance of remaining in the ED until their evaluation is complete.  Has been seen at [facility] earlier this year for palpitations. Was d/c with cardiology follow up and zio patch which showed sinus rhythm with occasional PAC/PVCs.    Laquida Cotrell T, PA-C 09/12/21 1017

## 2021-09-12 NOTE — ED Provider Notes (Signed)
[facility] EMERGENCY DEPARTMENT Provider Note   CSN: [id number] Arrival date & time: 09/12/21  1008     History {Add pertinent medical, surgical, social history, OB history to HPI:1} Chief Complaint  Patient presents with   Palpitations    James Anderson is a 26 y.o. male.  HPI Patient presenting for evaluation of irregular heart rhythm.  This was associated with some paresthesia in both of his hands and a fluttering feeling in his chest.  He has been using THC, vape administration.  He was evaluated by EMS and recommended to come here.  He states that today he was driving to the golf course to play golf, when he had onset of chest and upper back discomfort, with a sensation of fast and irregular heartbeat.  At that time he noticed that he was dizzy.  The symptoms have resolved.  He had prior similar episodes, after which he was evaluated, and saw cardiology who did a 2-week Holter monitor.  After his last evaluation by them in March 2023, he was told to return only as needed.  No other interventions were undertaken.  He is active as an Academic librarian, playing soccer regularly.  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  He states he may have some stress but is not unduly bothered by it.  He does not currently have a PCP.  He denies other recent illnesses.    Home Medications Prior to Admission medications   Not on File      Allergies    Morphine and related and Zyrtec [cetirizine]    Review of Systems   Review of Systems  Physical Exam Updated Vital Signs BP 112/67   Pulse 95   Temp 98.1 F (36.7 C) (Oral)   Resp 18   Ht 5\' 7"  (1.702 m)   Wt 61.2 kg   SpO2 100%   BMI 21.14 kg/m  Physical Exam Vitals and nursing note reviewed.  Constitutional:      General: He is not in acute distress.    Appearance: He is well-developed. He is not ill-appearing or diaphoretic.  HENT:     Head: Normocephalic and atraumatic.      Right Ear: External ear normal.     Left Ear: External ear normal.  Eyes:     Conjunctiva/sclera: Conjunctivae normal.     Pupils: Pupils are equal, round, and reactive to light.  Neck:     Trachea: Phonation normal.  Cardiovascular:     Rate and Rhythm: Normal rate and regular rhythm.     Heart sounds: Normal heart sounds.  Pulmonary:     Effort: Pulmonary effort is normal. No respiratory distress.     Breath sounds: Normal breath sounds. No stridor.  Abdominal:     General: There is no distension.     Palpations: Abdomen is soft.     Tenderness: There is no abdominal tenderness.  Musculoskeletal:        General: Normal range of motion.     Cervical back: Normal range of motion and neck supple.  Skin:    General: Skin is warm and dry.  Neurological:     Mental Status: He is alert and oriented to person, place, and time.     Cranial Nerves: No cranial nerve deficit.     Sensory: No sensory deficit.     Motor: No abnormal muscle tone.     Coordination: Coordination normal.  Psychiatric:  Mood and Affect: Mood normal.        Behavior: Behavior normal.        Thought Content: Thought content normal.        Judgment: Judgment normal.     ED Results / Procedures / Treatments   Labs (all labs ordered are listed, but only abnormal results are displayed) Labs Reviewed  BASIC METABOLIC PANEL - Abnormal; Notable for the following components:      Result Value   Potassium 3.4 (*)    All other components within normal limits  CBC - Abnormal; Notable for the following components:   RBC 3.77 (*)    Hemoglobin 12.6 (*)    HCT 35.9 (*)    RDW 11.0 (*)    All other components within normal limits    EKG None   Date: 09/12/2021  Rate: 76  Rhythm: normal sinus rhythm  QRS Axis: normal  PR and QT Intervals: normal  ST/T Wave abnormalities: normal  PR and QRS Conduction Disutrbances:none  Narrative Interpretation: Sinus arrhythmia  Old EKG Reviewed: unchanged    \  Radiology No results found.  Procedures Procedures  {Document cardiac monitor, telemetry assessment procedure when appropriate:1}  Medications Ordered in ED Medications - No data to display  ED Course/ Medical Decision Making/ A&P                           Medical Decision Making Patient being evaluated today for transient symptoms, similar to prior but somewhat worse.  He did use a vape device with CBD/THC, last at 2 AM today.  He is a Mining engineer.  Recently had comprehensive cardiology evaluation including Holter monitoring.  Symptoms have resolved and he has no recent illnesses.  Problems Addressed: Palpitations: acute illness or injury    Details: Recurrent symptoms previously evaluated by cardiology.  Differential diagnosis includes drug ingestion, panic attack, cardiac abnormality.  Amount and/or Complexity of Data Reviewed Independent Historian:     Details: He is cogent historian External Data Reviewed: radiology and notes.    Details: Cardiology notes including office visits.  Prior CTA when he was having similar symptoms. Labs: ordered.    Details: CBC, metabolic panel   ***  {Document critical care time when appropriate:1} {Document review of labs and clinical decision tools ie heart score, Chads2Vasc2 etc:1}  {Document your independent review of radiology images, and any outside records:1} {Document your discussion with family members, caretakers, and with consultants:1} {Document social determinants of health affecting pt's care:1} {Document your decision making why or why not admission, treatments were needed:1} Final Clinical Impression(s) / ED Diagnoses Final diagnoses:  Palpitations    Rx / DC Orders ED Discharge Orders     None

## 2021-09-12 NOTE — Discharge Instructions (Signed)
The testing today is reassuring.  Consider following up with cardiology using a message in my chart, to the last provider you saw James Anderson.  Sometimes symptoms like this are related to stress.  Consider talking to a primary care doctor for arrangements of further evaluation and treatment.

## 2021-09-12 NOTE — ED Triage Notes (Addendum)
Patient was driving and felt a chill through head and numbness tingling to hands along with dizziness and fluttering in chest.  EMS did EKG and sent patient to ER for evaluation due to abnormal ekg.  Hx of zio patch in feb with PAC and PVC but no sustained arrhythmia

## 2021-09-24 ENCOUNTER — Ambulatory Visit: Payer: Managed Care, Other (non HMO) | Admitting: Nurse Practitioner

## 2021-09-24 ENCOUNTER — Encounter: Payer: Self-pay | Admitting: Nurse Practitioner

## 2021-09-24 VITALS — BP 112/78 | HR 63 | Ht 66.0 in | Wt 133.0 lb

## 2021-09-24 DIAGNOSIS — I471 Supraventricular tachycardia: Secondary | ICD-10-CM | POA: Diagnosis not present

## 2021-09-24 DIAGNOSIS — R002 Palpitations: Secondary | ICD-10-CM

## 2021-09-24 MED ORDER — DILTIAZEM HCL 30 MG PO TABS: 30.0000 mg | ORAL_TABLET | Freq: Every day | ORAL | 3 refills | Status: AC | PRN

## 2021-09-24 NOTE — Patient Instructions (Signed)
Medication Instructions:  Your physician has recommended you make the following change in your medication:   START Diltiazem 30 mg once daily as needed for palpitations.  *If you need a refill on your cardiac medications before your next appointment, please call your pharmacy*   Lab Work: None  If you have labs (blood work) drawn today and your tests are completely normal, you will receive your results only by: MyChart Message (if you have MyChart) OR A paper copy in the mail If you have any lab test that is abnormal or we need to change your treatment, we will call you to review the results.   Testing/Procedures: None   Follow-Up: At [facility], you and your health needs are our priority.  As part of our continuing mission to provide you with exceptional heart care, we have created designated Provider Care Teams.  These Care Teams include your primary Cardiologist (physician) and Advanced Practice Providers (APPs -  Physician Assistants and Nurse Practitioners) who all work together to provide you with the care you need, when you need it.   Your next appointment:   4 month(s)  The format for your next appointment:   In Person  Provider:   Yvonne Kendall, MD or Nicolasa Ducking, NP       Important Information About Sugar

## 2021-09-24 NOTE — Progress Notes (Signed)
Office Visit    Patient Name: James Anderson Date of Encounter: 09/24/2021  Primary Care Provider:  Pcp, No Primary Cardiologist:  Yvonne Kendall, MD  Chief Complaint    26 year old male who presents for follow-up of palpitations.  Past Medical History    Past Medical History:  Diagnosis Date   Concussion    Palpitations    a. 04/2021 Zio: Predominantly sinus rhythm at 76 bpm (38-182).  Triggered events corresponded to sinus rhythm and rare isolated PACs/PVCs.  No sustained arrhythmias or prolonged pauses.   Past Surgical History:  Procedure Laterality Date   NOSE SURGERY     WRIST SURGERY Bilateral    2008 (left), 2022 (right)    Allergies  Allergies  Allergen Reactions   Morphine And Related Anaphylaxis   Zyrtec [Cetirizine] Hives    History of Present Illness    26 year old male with a history of palpitations and prior concussion.  Earlier this year, he was seen in the emergency department secondary to palpitations associated dyspnea.  ED work-up was unremarkable other than sinus tachycardia, noted in triage.  CT of the chest was negative for PE or other acute abnormality.  He was seen in cardiology clinic in February, at which time he reported intermittent palpitations and a "foggy" feeling in his head.  ECG and physical exam were unremarkable.  He underwent event monitoring which showed predominantly sinus rhythm.  Triggered events corresponded to sinus rhythm and rare isolated PACs/PVCs.  There were no sustained arrhythmias or pauses.  Mr. Ainsley was last seen in cardiology clinic in mid March, at which time he reported less frequent palpitations, predominantly occurring when lying in bed at night.  On June 16, while driving to work, he had a sudden sensation of a wave coming over him, which she describes as suddenly feeling chills of his spine followed by lightheadedness and tachypalpitations.  He initially pulled over on the side of the road and when symptoms  persisted, he called his girlfriend and also called EMS.  EMS ECGs show sinus tachycardia with sinus arrhythmia and possible brief runs of atrial tachycardia with an average heart rate of 108 bpm.  Patient indicates that the night prior to this event, he had been vaping THC, which she had not done in a very long time.  He also says just prior to getting into the car, he got a text from his boss, which put him on edge.  In the emergency department, ECG and labs were unremarkable.  He was subsequently discharged home.  The following evening, he had a another episode of tachypalpitations without associated symptoms lasting just a few seconds, and resolving spontaneously.  He has had no recurrence over the past several days.  He remains active and works for a [employer].  He does not experience any symptoms or limitations in his routine activities.  He denies chest pain, dyspnea, PND, orthopnea, syncope, edema, or early satiety.  Home Medications    Current Outpatient Medications  Medication Sig Dispense Refill   diltiazem (CARDIZEM) 30 MG tablet Take 1 tablet (30 mg total) by mouth daily as needed (As needed for palpitations). 90 tablet 3   No current facility-administered medications for this visit.     Review of Systems    Tachypalpitations and lightheadedness as outlined above.  He denies chest pain, dyspnea, PND, orthopnea, syncope, edema, or early satiety.  All other systems reviewed and are otherwise negative except as noted above.    Physical  Exam    VS:  BP 112/78   Pulse 63   Ht 5\' 6"  (1.676 m)   Wt 133 lb (60.3 kg)   SpO2 98%   BMI 21.47 kg/m  , BMI Body mass index is 21.47 kg/m.     GEN: Well nourished, well developed, in no acute distress. HEENT: normal. Neck: Supple, no JVD, carotid bruits, or masses. Cardiac: RRR, no murmurs, rubs, or gallops. No clubbing, cyanosis, edema.   Respiratory:  Respirations regular and unlabored, clear to auscultation  bilaterally. GI: Soft, nontender, nondistended, BS + x 4. MS: no deformity or atrophy. Skin: warm and dry, no rash. Neuro:  Strength and sensation are intact. Psych: Normal affect.  Accessory Clinical Findings    ECG personally reviewed by me today -sinus arrhythmia, 63, rightward axis- no acute changes.  EMS Rhythm strip from 09/12/2021: Regular sinus rhythm with sinus arrhythmia and possible atrial tachycardia.  Average rate of approximately 108 bpm.  Lab Results  Component Value Date   WBC 5.0 09/12/2021   HGB 12.6 (L) 09/12/2021   HCT 35.9 (L) 09/12/2021   MCV 95.2 09/12/2021   PLT 167 09/12/2021   Lab Results  Component Value Date   CREATININE 1.06 09/12/2021   BUN 11 09/12/2021   NA 138 09/12/2021   K 3.4 (L) 09/12/2021   CL 104 09/12/2021   CO2 25 09/12/2021    Assessment & Plan    1.  Tachypalpitations/?  Paroxysmal atrial tachycardia: Patient with prior history of palpitations and prior event monitoring showing predominantly sinus rhythm with rare PACs and PVCs.  No previously documented sustained arrhythmias.  He was in his usual state of health until the morning of June 16, when he developed tachypalpitations with lightheadedness and flushing while driving.  Event was preceded by smoking THC the prior evening, and then receiving a text from his boss that morning that caused anxiety for him.  He pulled over and called EMS and ECG performed showed sinus tachycardia with arrhythmia at an average rate of 108 bpm (strips available for review today).  I reviewed his rhythm strips with Drs End and [birth year]-1993 and there is a slight change in P wave morphology during higher heart rates, possibly suggesting an atrial tachycardia.  This self terminates.  Work-up in the ED was unremarkable.  1 brief recurrent episode the following day and none since.  ECG today is similar to prior ECGs with sinus arrhythmia and a rightward axis.  Reassurance provided.  Discussed the importance of avoidance  of toxic substances that might contribute to palpitations.  We discussed vagal maneuvers I will send in a prescription for diltiazem 30 mg to be used as needed for more sustained arrhythmias.  We again discussed the use of mobile devices to assess for rhythm disturbances in an effort to keep him out of the emergency department.  He is going to look into a [employer] mobile versus [place].  2.  Disposition: Follow-up in 4 months or sooner if necessary.  [employer], NP 09/24/2021, 3:07 PM

## 2022-01-23 ENCOUNTER — Ambulatory Visit: Payer: Managed Care, Other (non HMO) | Attending: Nurse Practitioner | Admitting: Nurse Practitioner

## 2022-01-23 NOTE — Progress Notes (Incomplete)
    Office Visit    Patient Name: James Anderson Date of Encounter: 01/23/2022  Primary Care Provider:  Pcp, No Primary Cardiologist:  Nelva Bush, MD  Chief Complaint    26 year old male with parents for follow-up related to palpitations.  Past Medical History    Past Medical History:  Diagnosis Date   Concussion    Palpitations    a. 04/2021 Zio: Predominantly sinus rhythm at 76 bpm (38-182).  Triggered events corresponded to sinus rhythm and rare isolated PACs/PVCs.  No sustained arrhythmias or prolonged pauses.   Past Surgical History:  Procedure Laterality Date   NOSE SURGERY     WRIST SURGERY Bilateral    2008 (left), 2022 (right)    Allergies  Allergies  Allergen Reactions   Morphine And Related Anaphylaxis   Zyrtec [Cetirizine] Hives    History of Present Illness    26 year old male with a history of palpitations and prior concussion.  In early 2023, he was seen in the emergency department secondary to palpitations and associated dyspnea.  ED work-up was unremarkable other than sinus tachycardia, noted in triage.  CTA of the chest was negative for PE or other acute abnormality.  He was seen in cardiology clinic in fibber 2023, at which time he reported intermittent palpitations and a "foggy" feeling in his head.  ECG and physical exam were unremarkable.  Event monitoring showed predominantly sinus rhythm.  Triggered events corresponded to sinus rhythm and rare, isolated PACs and PVCs.  There were no sustained arrhythmias or pauses.  James Anderson was last seen in cardiology clinic in June 2023  Home Medications    Current Outpatient Medications  Medication Sig Dispense Refill   diltiazem (CARDIZEM) 30 MG tablet Take 1 tablet (30 mg total) by mouth daily as needed (As needed for palpitations). 90 tablet 3   No current facility-administered medications for this visit.     Review of Systems    ***.  All other systems reviewed and are otherwise negative except  as noted above.    Physical Exam    VS:  There were no vitals taken for this visit. , BMI There is no height or weight on file to calculate BMI.     GEN: Well nourished, well developed, in no acute distress. HEENT: normal. Neck: Supple, no JVD, carotid bruits, or masses. Cardiac: RRR, no murmurs, rubs, or gallops. No clubbing, cyanosis, edema.  Radials/DP/PT 2+ and equal bilaterally.  Respiratory:  Respirations regular and unlabored, clear to auscultation bilaterally. GI: Soft, nontender, nondistended, BS + x 4. MS: no deformity or atrophy. Skin: warm and dry, no rash. Neuro:  Strength and sensation are intact. Psych: Normal affect.  Accessory Clinical Findings    ECG personally reviewed by me today - *** - no acute changes.  Lab Results  Component Value Date   WBC 5.0 09/12/2021   HGB 12.6 (L) 09/12/2021   HCT 35.9 (L) 09/12/2021   MCV 95.2 09/12/2021   PLT 167 09/12/2021   Lab Results  Component Value Date   CREATININE 1.06 09/12/2021   BUN 11 09/12/2021   NA 138 09/12/2021   K 3.4 (L) 09/12/2021   CL 104 09/12/2021   CO2 25 09/12/2021   No results found for: "ALT", "AST", "GGT", "ALKPHOS", "BILITOT" No results found for: "CHOL", "HDL", "LDLCALC", "LDLDIRECT", "TRIG", "CHOLHDL"  No results found for: "HGBA1C"  Assessment & Plan    1.  ***   Murray Hodgkins, NP 01/23/2022, 8:41 AM

## 2022-01-26 ENCOUNTER — Encounter: Payer: Self-pay | Admitting: Nurse Practitioner

## 2022-05-11 IMAGING — CR DG FOREARM 2V*R*
2 series · 2 of 2 positions shown · non-contrast
Comparison: None.

CLINICAL DATA: Right arm pain after injury.

EXAM:
RIGHT FOREARM - 2 VIEW

[forearm ap]
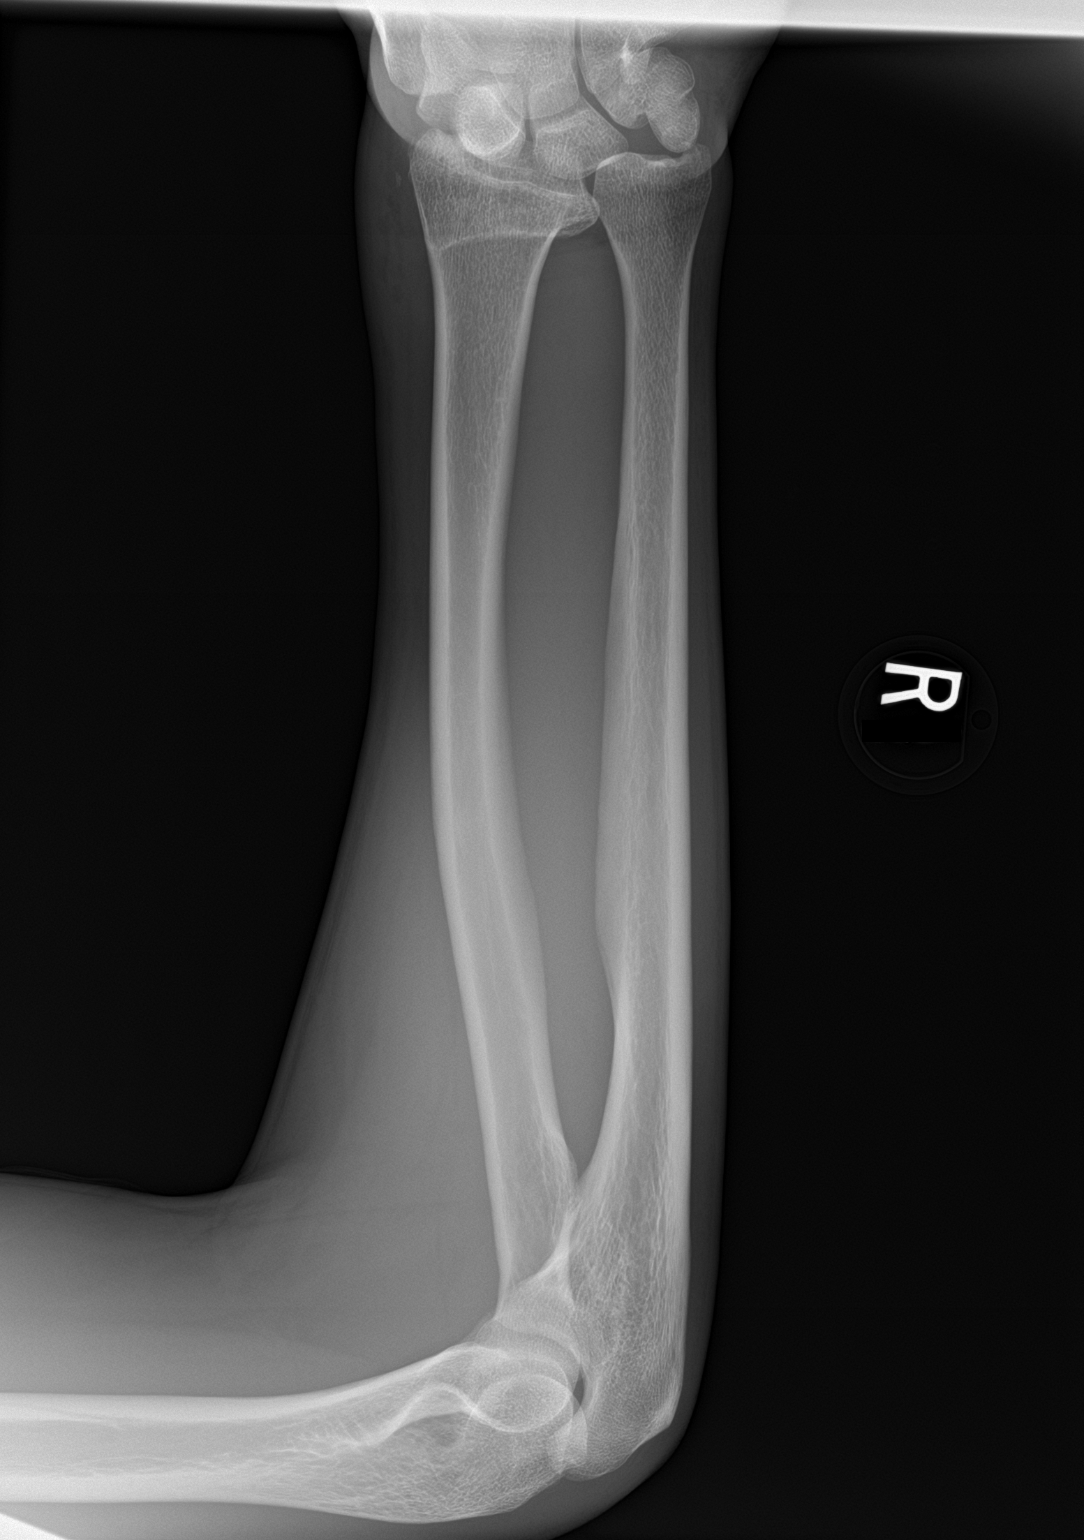

[forearm lat]
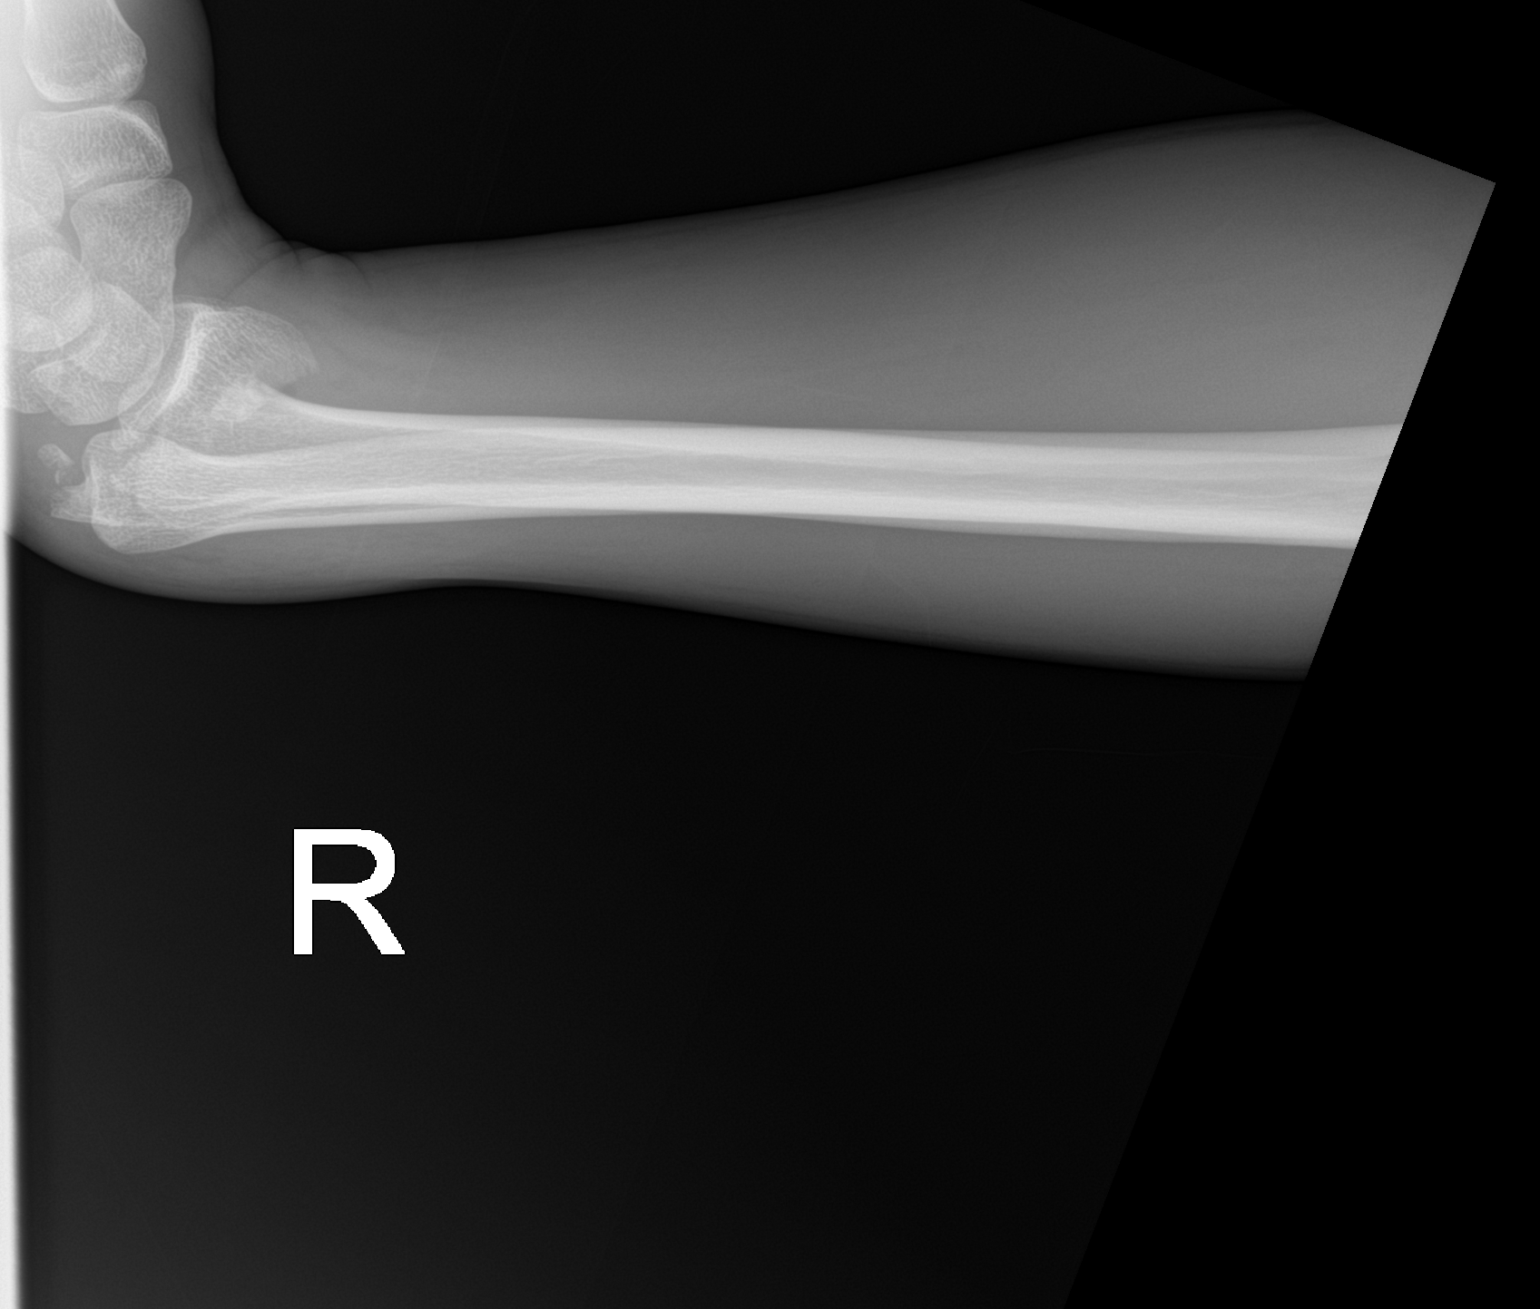

[2 of 2 positions shown; findings below may reference images not displayed]

FINDINGS: Severely displaced fracture is seen involving the distal right
radius. Moderately displaced ulnar styloid fracture is noted.
IMPRESSION: Distal right radial fracture is noted with severe volar displacement
of distal fracture fragment. Moderately displaced ulnar styloid
fracture.

## 2022-05-11 IMAGING — DX DG WRIST 2V*R*
1 series · 2 of 2 positions shown · non-contrast
Comparison: Prior today

CLINICAL DATA: Right wrist fracture.  Status post reduction.

EXAM:
RIGHT WRIST - 2 VIEW

[Series 1: wrist · 0.14mm/px · 2 of 2 slices shown]
[im 1/2]
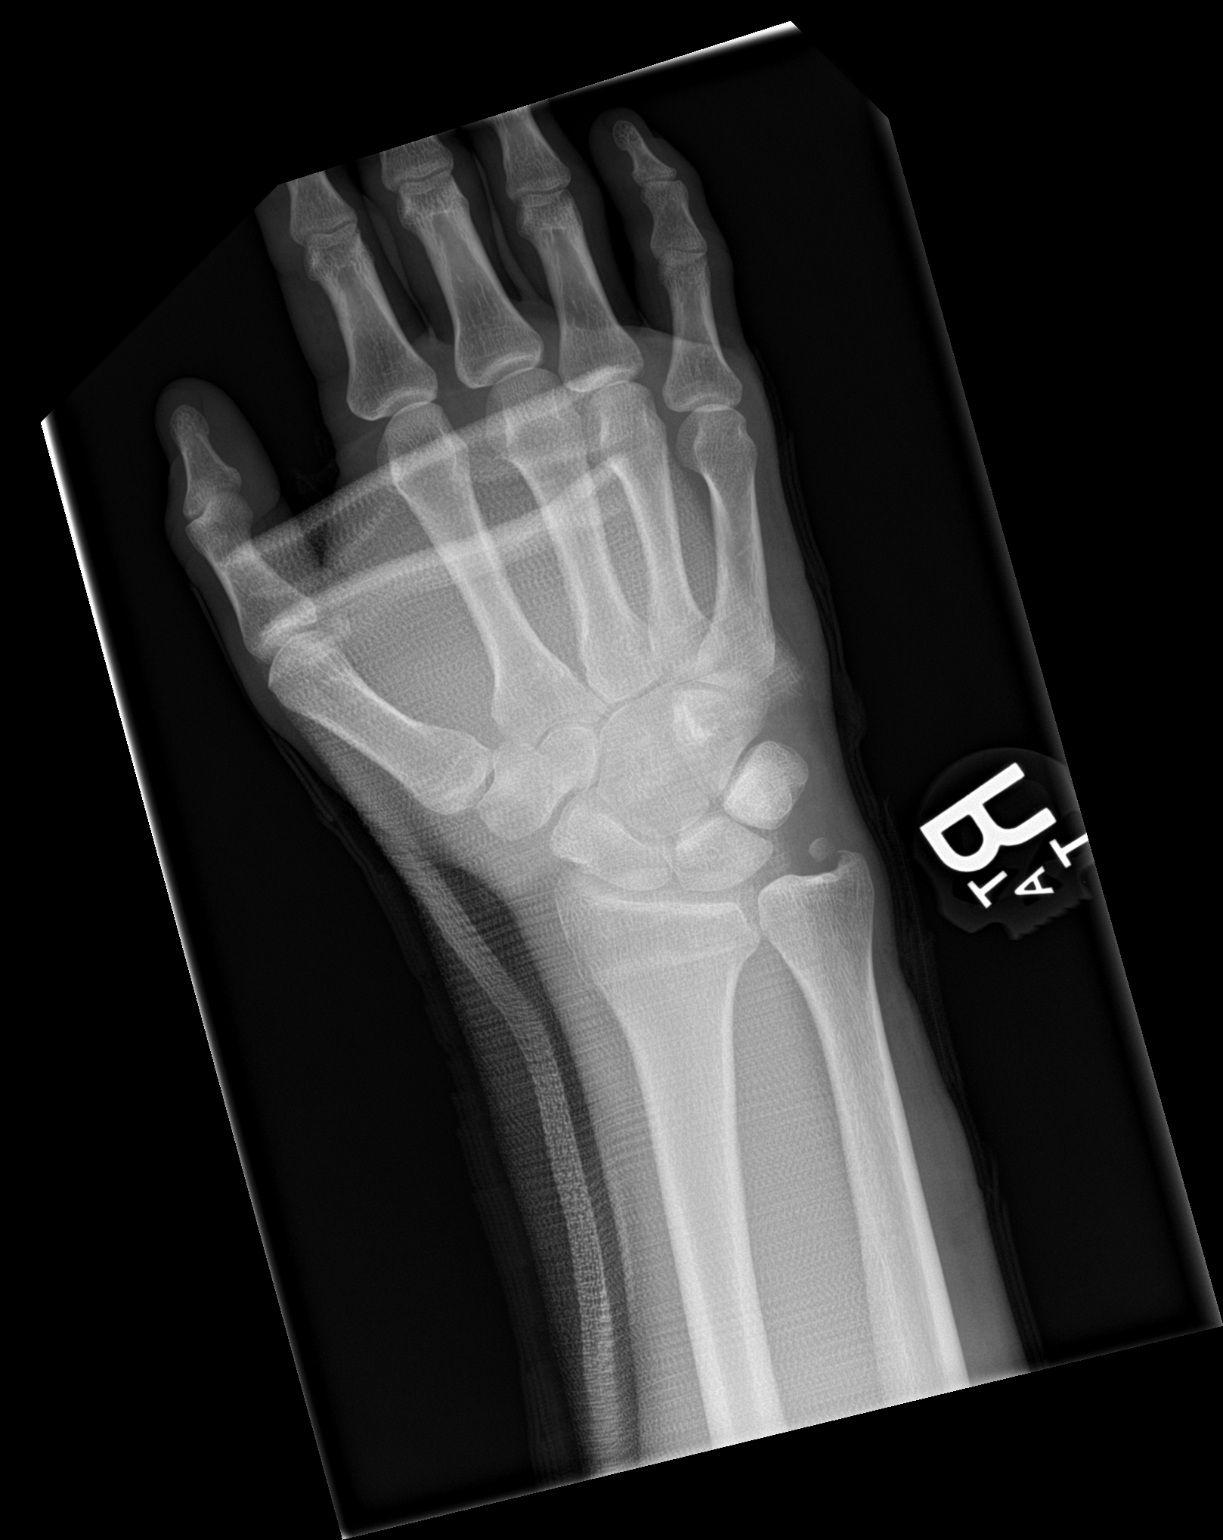
[im 2/2]
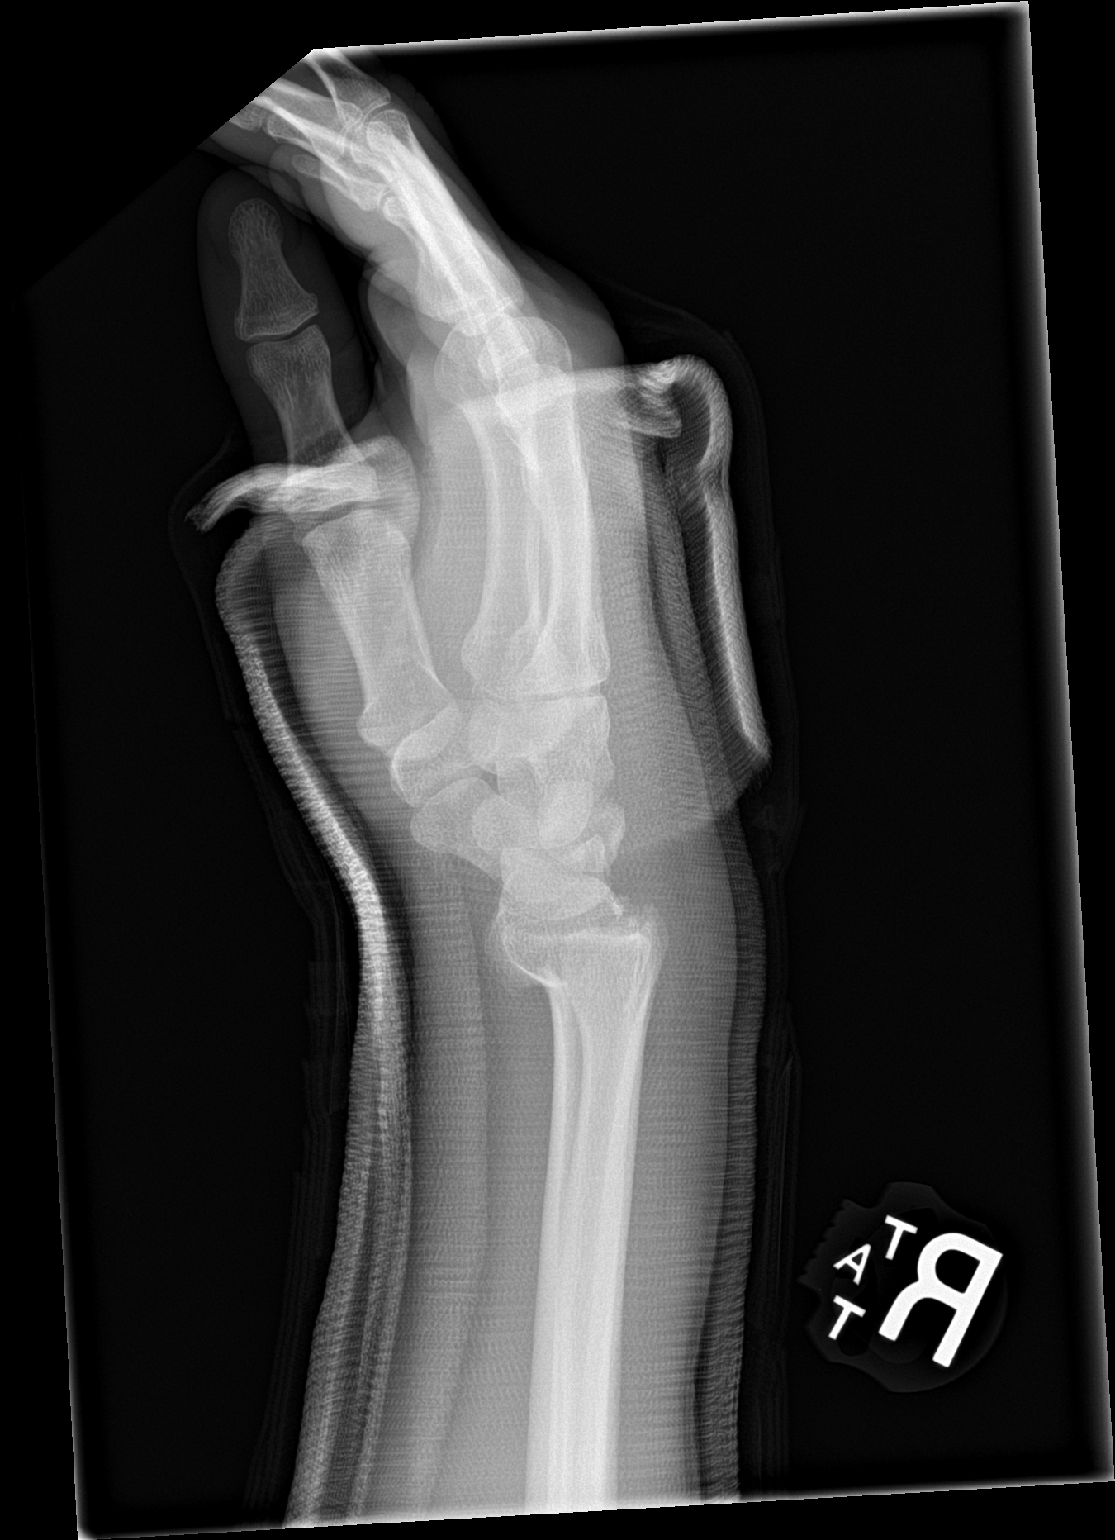

[2 of 2 positions shown; findings below may reference images not displayed]

FINDINGS: Transverse fracture of the distal radial metaphysis is again seen.
Anterior displacement of the distal fracture fragment is noted, but
decreased since prior exam. No significant angulation is seen. Ulnar
styloid process fracture again noted. Cast has been applied.
IMPRESSION: Transverse fracture of the distal radial metaphysis, with decreased
anterior displacement and angulation since prior exam.

Ulnar styloid process fracture again noted.
